# Patient Record
Sex: Male | Born: 1970 | Race: Black or African American | Hispanic: No | Marital: Single | Smoking: Former smoker
Health system: Southern US, Community
[De-identification: ages and names within clinical notes are randomized; demographics above are authoritative.]

---

## 2005-04-09 ENCOUNTER — Ambulatory Visit: Payer: Self-pay | Admitting: Otolaryngology

## 2005-04-30 ENCOUNTER — Ambulatory Visit: Payer: Self-pay | Admitting: Otolaryngology

## 2007-05-25 ENCOUNTER — Emergency Department: Payer: Self-pay | Admitting: Emergency Medicine

## 2009-03-13 ENCOUNTER — Emergency Department: Payer: Self-pay | Admitting: Emergency Medicine

## 2009-03-15 ENCOUNTER — Inpatient Hospital Stay: Payer: Self-pay | Admitting: Internal Medicine

## 2010-05-14 ENCOUNTER — Emergency Department: Payer: Self-pay | Admitting: Emergency Medicine

## 2018-03-22 ENCOUNTER — Encounter: Payer: Self-pay | Admitting: Emergency Medicine

## 2018-03-22 ENCOUNTER — Emergency Department: Payer: Self-pay

## 2018-03-22 ENCOUNTER — Emergency Department
Admission: EM | Admit: 2018-03-22 | Discharge: 2018-03-22 | Disposition: A | Discharge status: Home / Self Care | Payer: Self-pay | Attending: Emergency Medicine | Admitting: Emergency Medicine

## 2018-03-22 ENCOUNTER — Other Ambulatory Visit: Payer: Self-pay

## 2018-03-22 DIAGNOSIS — Z87891 Personal history of nicotine dependence: Secondary | ICD-10-CM | POA: Insufficient documentation

## 2018-03-22 DIAGNOSIS — Y998 Other external cause status: Secondary | ICD-10-CM | POA: Insufficient documentation

## 2018-03-22 DIAGNOSIS — S60221A Contusion of right hand, initial encounter: Secondary | ICD-10-CM | POA: Insufficient documentation

## 2018-03-22 DIAGNOSIS — Y939 Activity, unspecified: Secondary | ICD-10-CM | POA: Insufficient documentation

## 2018-03-22 DIAGNOSIS — W540XXA Bitten by dog, initial encounter: Secondary | ICD-10-CM | POA: Insufficient documentation

## 2018-03-22 DIAGNOSIS — Z23 Encounter for immunization: Secondary | ICD-10-CM | POA: Insufficient documentation

## 2018-03-22 DIAGNOSIS — Y92009 Unspecified place in unspecified non-institutional (private) residence as the place of occurrence of the external cause: Secondary | ICD-10-CM | POA: Insufficient documentation

## 2018-03-22 MED ORDER — TETANUS-DIPHTH-ACELL PERTUSSIS 5-2.5-18.5 LF-MCG/0.5 IM SUSP
0.5000 mL | Freq: Once | INTRAMUSCULAR | Status: AC
  Administered 2018-03-22: 0.5 mL via INTRAMUSCULAR
  Filled 2018-03-22: qty 0.5

## 2018-03-22 MED ORDER — AMOXICILLIN-POT CLAVULANATE 875-125 MG PO TABS: 1.0000 | ORAL_TABLET | Freq: Two times a day (BID) | ORAL | 0 refills | Status: AC

## 2018-03-22 NOTE — ED Triage Notes (Signed)
Dog bites couple days ago.  Says the dog is back, but he is unsure of shots.  Right had is swollen where he was bitten there.  He says he wa also bitten on leg and side.

## 2018-03-22 NOTE — ED Provider Notes (Signed)
[facility] Emergency Department Provider Note   ____________________________________________   First MD Initiated Contact with Patient 03/22/18 1358     (approximate)  I have reviewed the triage vital signs and the nursing notes.   HISTORY  Chief Complaint Animal Bite and Hand Pain  HPI Keith Stevenson is a 47 y.o. male presents to the ED with complaint of a dog bite.  Patient states that 2 days ago he was at his neighbor's house.  He states that the dog that bit him had puppies.  This is the only reason he believes that he was bitten is because he was near the puppies.  Patient was bit on his right side and also left thigh.  Patient reports that he fell backwards injuring his right hand and had a scrape to his scalp.  He denies any loss of consciousness.  He states he is continued to have no problems since that time other than his right hand has been swollen and painful.  His neighbor has papers to prove that the dog is up-to-date on rabies immunizations.  He states that [employer] was called.  Patient is unsure of the last tetanus he received.  History reviewed. No pertinent past medical history.  There are no active problems to display for this patient.   History reviewed. No pertinent surgical history.  Prior to Admission medications   Medication Sig Start Date End Date Taking? Authorizing Provider  amoxicillin-clavulanate (AUGMENTIN) 875-125 MG tablet Take 1 tablet by mouth 2 (two) times daily for 7 days. 03/22/18 03/29/18  Tommi Rumps, PA-C    Allergies Patient has no known allergies.  No family history on file.  Social History Social History   Tobacco Use  . Smoking status: Former Smoker  Substance Use Topics  . Alcohol use: Not on file  . Drug use: Not on file    Review of Systems Constitutional: No fever/chills Eyes: No visual changes. ENT: No trauma. Cardiovascular: Denies chest pain. Respiratory: Denies  shortness of breath. Gastrointestinal: No abdominal pain.  No nausea, no vomiting.   Musculoskeletal: Positive right hand pain. Skin: Positive abrasion scalp, dog bite right trunk and left thigh. Neurological: Negative for headaches, focal weakness or numbness. ___________________________________________   PHYSICAL EXAM:  VITAL SIGNS: ED Triage Vitals  Enc Vitals Group     BP 03/22/18 1203 (!) 140/96     Pulse Rate 03/22/18 1203 80     Resp 03/22/18 1203 18     Temp 03/22/18 1203 98.7 F (37.1 C)     Temp Source 03/22/18 1203 Oral     SpO2 03/22/18 1203 98 %     Weight 03/22/18 1205 282 lb (127.9 kg)     Height 03/22/18 1205 6\' 7"  (2.007 m)     Head Circumference --      Peak Flow --      Pain Score 03/22/18 1205 7     Pain Loc --      Pain Edu? --      Excl. in GC? --    Constitutional: Alert and oriented. Well appearing and in no acute distress. Eyes: Conjunctivae are normal. PERRL. EOMI. Head: Atraumatic. Nose: No trauma. Neck: No stridor.  No cervical tenderness on palpation posteriorly. Cardiovascular: Normal rate, regular rhythm. Grossly normal heart sounds.  Good peripheral circulation. Respiratory: Normal respiratory effort.  No retractions. Lungs CTAB. Gastrointestinal: Soft and nontender. No distention.   Musculoskeletal:  Right anterior trunk there is a isolated superficial  puncture wound without evidence of infection.  No drainage is from the site.  Minimal tenderness.  On examination of the left thigh anteriorly mid thigh there is no ecchymosis and 2 puncture wounds are present without drainage.  Patient is ambulatory without any assistance. Neurologic:  Normal speech and language. No gross focal neurologic deficits are appreciated.  Skin:  Skin is warm, dry and intact.  Dog bite puncture wounds as noted above. Psychiatric: Mood and affect are normal. Speech and behavior are normal.  ____________________________________________   LABS (all labs ordered are  listed, but only abnormal results are displayed)  Labs Reviewed - No data to display ____________________________________________  RADIOLOGY  ED MD interpretation:   Right hand x-ray is negative for acute injury.  Official radiology report(s): Dg Hand Complete Right  Result Date: 03/22/2018 CLINICAL DATA:  Right hand pain and swelling after fall. EXAM: RIGHT HAND - COMPLETE 3+ VIEW COMPARISON:  None. FINDINGS: There is no evidence of fracture or dislocation. There is no evidence of arthropathy or other focal bone abnormality. Soft tissues are unremarkable. IMPRESSION: Negative. Electronically Signed   By: Lupita Raider, M.D.   On: 03/22/2018 12:43    ____________________________________________   PROCEDURES  Procedure(s) performed: None  Procedures  Critical Care performed: No  ____________________________________________   INITIAL IMPRESSION / ASSESSMENT AND PLAN / ED COURSE  As part of my medical decision making, I reviewed the following data within the electronic MEDICAL RECORD NUMBER Notes from prior ED visits and [place] Controlled Substance Database  Patient presents to the ED with complaint of 2 individual dog bites that occurred 2 days ago.  Incidents was caused by patient getting too close to his neighbors dogs puppies.  Patient states that neighbor does have papers proving that the dog has up-to-date rabies immunizations.  Areas appear to be healing without any signs of infection.  Patient was placed on Augmentin 875 twice daily for the next 7 days.  He is to continue keeping the areas clean and dry and to clean daily with mild soap and water.  He is to return to the emergency department if any signs of infection or any problems.  ____________________________________________   FINAL CLINICAL IMPRESSION(S) / ED DIAGNOSES  Final diagnoses:  Dog bite, initial encounter  Contusion of right hand, initial encounter     ED Discharge Orders         Ordered     amoxicillin-clavulanate (AUGMENTIN) 875-125 MG tablet  2 times daily     03/22/18 1421           Note:  This document was prepared using Dragon voice recognition software and may include unintentional dictation errors.    Tommi Rumps, PA-C 03/22/18 1615    Sharyn Creamer, MD 03/22/18 [phone]

## 2018-03-22 NOTE — ED Notes (Signed)
Per officer Sigurd Sos, dog bite has already been reported to [employer].

## 2018-03-22 NOTE — ED Triage Notes (Signed)
Dog bite L thigh and abdomen 2 days ago. States was his neighbors dog. States his neighbor said dog had rabies 1 1/2 year ago but did not produce paperwork. Dog was pit bull. Did not report to animal control. Occurred in town of [place]. Also R hand swollen, fell at time.

## 2018-03-22 NOTE — ED Notes (Addendum)
Pt presents with dog bites to left leg, right side of torso. Pt states he didn't come in Saturday because the dog ran off and he was trying to find the dog. The dog is back home now. Owner states that dog has had rabies shot but cannot produce paperwork. Pt also complains of pain in his right hand, which is swollen, and the back of his head, which were injured when he was backing away from the dog and fell. Pt states he was told he passed out for a few seconds. Pt alert & oriented with nad noted.

## 2018-03-22 NOTE — Discharge Instructions (Signed)
Follow-up with your primary care provider or [facility] acute care if any continued problems.  Return to the emergency department if any severe worsening of your symptoms or urgent concerns.  Return to the emergency department also if any of the bite marks become infected.  Begin taking Augmentin 875 twice daily for the next 7 days.  You also need to clean the bite areas with mild soap and water twice a day.  You may take Tylenol or ibuprofen as needed for pain.  You are also given a tetanus booster.

## 2021-10-03 ENCOUNTER — Other Ambulatory Visit: Payer: Self-pay

## 2021-10-03 ENCOUNTER — Emergency Department: Payer: Self-pay

## 2021-10-03 ENCOUNTER — Emergency Department
Admission: EM | Admit: 2021-10-03 | Discharge: 2021-10-03 | Disposition: A | Discharge status: Home / Self Care | Payer: Self-pay | Attending: Emergency Medicine | Admitting: Emergency Medicine

## 2021-10-03 DIAGNOSIS — Z23 Encounter for immunization: Secondary | ICD-10-CM | POA: Insufficient documentation

## 2021-10-03 DIAGNOSIS — W540XXA Bitten by dog, initial encounter: Secondary | ICD-10-CM

## 2021-10-03 DIAGNOSIS — R030 Elevated blood-pressure reading, without diagnosis of hypertension: Secondary | ICD-10-CM

## 2021-10-03 DIAGNOSIS — S8392XA Sprain of unspecified site of left knee, initial encounter: Secondary | ICD-10-CM

## 2021-10-03 DIAGNOSIS — W57XXXA Bitten or stung by nonvenomous insect and other nonvenomous arthropods, initial encounter: Secondary | ICD-10-CM | POA: Insufficient documentation

## 2021-10-03 MED ORDER — TETANUS-DIPHTH-ACELL PERTUSSIS 5-2.5-18.5 LF-MCG/0.5 IM SUSY
0.5000 mL | PREFILLED_SYRINGE | Freq: Once | INTRAMUSCULAR | Status: AC
  Administered 2021-10-03: 0.5 mL via INTRAMUSCULAR
  Filled 2021-10-03: qty 0.5

## 2021-10-03 MED ORDER — AMOXICILLIN-POT CLAVULANATE 875-125 MG PO TABS: 1.0000 | ORAL_TABLET | Freq: Two times a day (BID) | ORAL | 0 refills | Status: AC

## 2021-10-03 NOTE — Discharge Instructions (Addendum)
Follow-up with urgent care or [facility] acute care if any continued problems or concerns of infection to urinate.  Clean area daily with mild soap and water and watch for signs of infection however a prescription for an antibiotic was sent to the pharmacy to begin taking today.  You may continue taking ibuprofen or Tylenol as needed for your knee.  Elevate and apply ice as needed for knee pain.

## 2021-10-03 NOTE — ED Triage Notes (Signed)
Pt reports a Journalist, newspaper canine bit him on the left leg yesterday while standing on the street. Bleeding controlled

## 2021-10-03 NOTE — ED Provider Notes (Signed)
[facility] Provider Note    Event Date/Time   First MD Initiated Contact with Patient 10/03/21 [phone]     (approximate)   History   Animal Bite   HPI  Keith Stevenson is a 51 y.o. male   presents to the ED with complaint of dog bite to his left knee that occurred yesterday.  Patient was bit by a Software engineer in [place].  He he also reports he has had knee pain as he was backing up to prevent the dog from biting him and stepped in a hole.  He is unsure of his last tetanus immunization.  He denies any health problems.      Physical Exam   Triage Vital Signs: ED Triage Vitals [10/03/21 0925]  Enc Vitals Group     BP (!) 158/116     Pulse Rate 96     Resp 16     Temp 98.3 F (36.8 C)     Temp Source Oral     SpO2 97 %     Weight      Height      Head Circumference      Peak Flow      Pain Score      Pain Loc      Pain Edu?      Excl. in GC?     Most recent vital signs: Vitals:   10/03/21 0926 10/03/21 1040  BP:  (!) 143/105  Pulse: 87   Resp:    Temp:    SpO2: 98%      General: Awake, no distress.  CV:  Good peripheral perfusion.  Heart regular rate and rhythm. Resp:  Normal effort.  Lungs are clear bilaterally. Abd:  No distention.  Other:  Left knee anteriorly   ED Results / Procedures / Treatments   Labs (all labs ordered are listed, but only abnormal results are displayed) Labs Reviewed - No data to display    RADIOLOGY  Left knee x-ray images were interpreted by myself independent of the radiologist and no tooth fragments or bony injury is noted.  Radiology report is negative.   PROCEDURES:  Critical Care performed:   Procedures   MEDICATIONS ORDERED IN ED: Medications  Tdap (BOOSTRIX) injection 0.5 mL (0.5 mLs Intramuscular Given 10/03/21 1022)     IMPRESSION / MDM / ASSESSMENT AND PLAN / ED COURSE  I reviewed the triage vital signs and the nursing notes.   Differential diagnosis includes, but is  not limited to, dog bite, infected dog bite, foreign body left knee, left knee sprain, left knee fracture.  Elevated blood pressure, hypertension undiagnosed.   51 year old male presents to the ED with complaint of a dog bite that occurred yesterday from a Software engineer in [place].  Patient also complains that his knee has been hurting due to stepping back and possibly stepping into a hole which caused him to twist his knee.  Patient has continued to ambulate since that time.  He is unaware of last immunizations and a Tdap was given to him while in the emergency department.  We discussed his elevated blood pressure and to his knowledge there is no family history of hypertension.  I stressed that this needs to be checked outside of the emergency department and also he needs to find a primary care provider.  [facility] given to him not only to recheck his blood pressure but also to obtain a  PCP.  A prescription for Augmentin was sent to the pharmacy to take for the next 7 days.  Patient reports that he understands and will comply.       FINAL CLINICAL IMPRESSION(S) / ED DIAGNOSES   Final diagnoses:  Dog bite, initial encounter  Sprain of left knee, unspecified ligament, initial encounter  Elevated blood pressure reading     Rx / DC Orders   ED Discharge Orders          Ordered    amoxicillin-clavulanate (AUGMENTIN) 875-125 MG tablet  2 times daily        10/03/21 1054             Note:  This document was prepared using Dragon voice recognition software and may include unintentional dictation errors.   Tommi Rumps, PA-C 10/03/21 1106    Concha Se, MD 10/03/21 1344

## 2021-10-03 NOTE — ED Notes (Signed)
See triage note. Pt brought to room in wheelchair. Pt was bit by canine yesterday. Pt c/o 8/10 left knee and leg pain. Bandage taken off and bleeding controlled at this time.

## 2021-11-05 ENCOUNTER — Telehealth: Payer: Self-pay

## 2024-01-13 IMAGING — DX DG KNEE COMPLETE 4+V*L*
4 series · 4 of 4 positions shown · non-contrast
Comparison: None Available.

CLINICAL DATA: Dog bite and knee injury.

EXAM:
LEFT KNEE - COMPLETE 4+ VIEW

[knee ap]
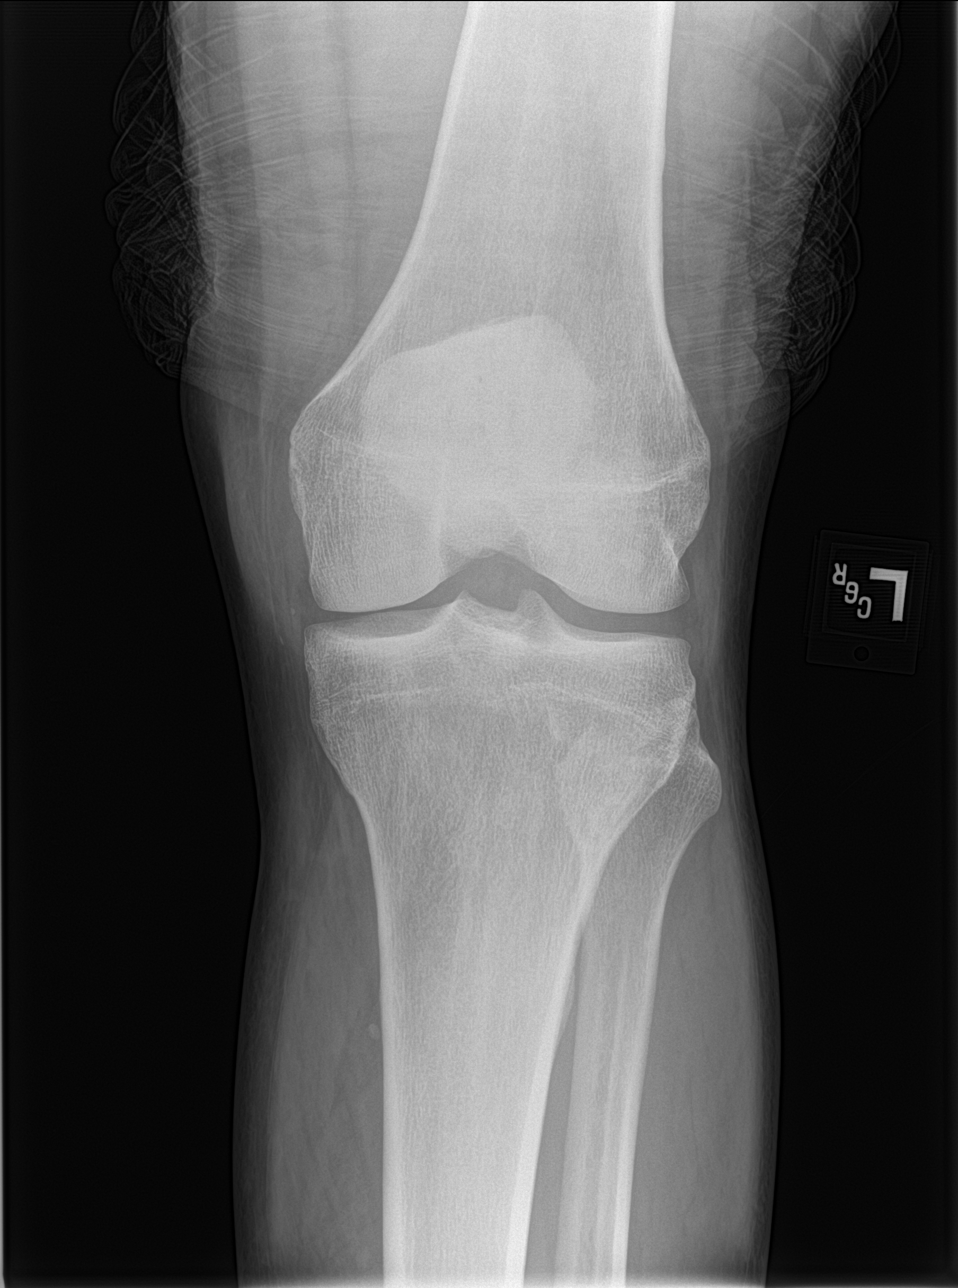

[knee lat]
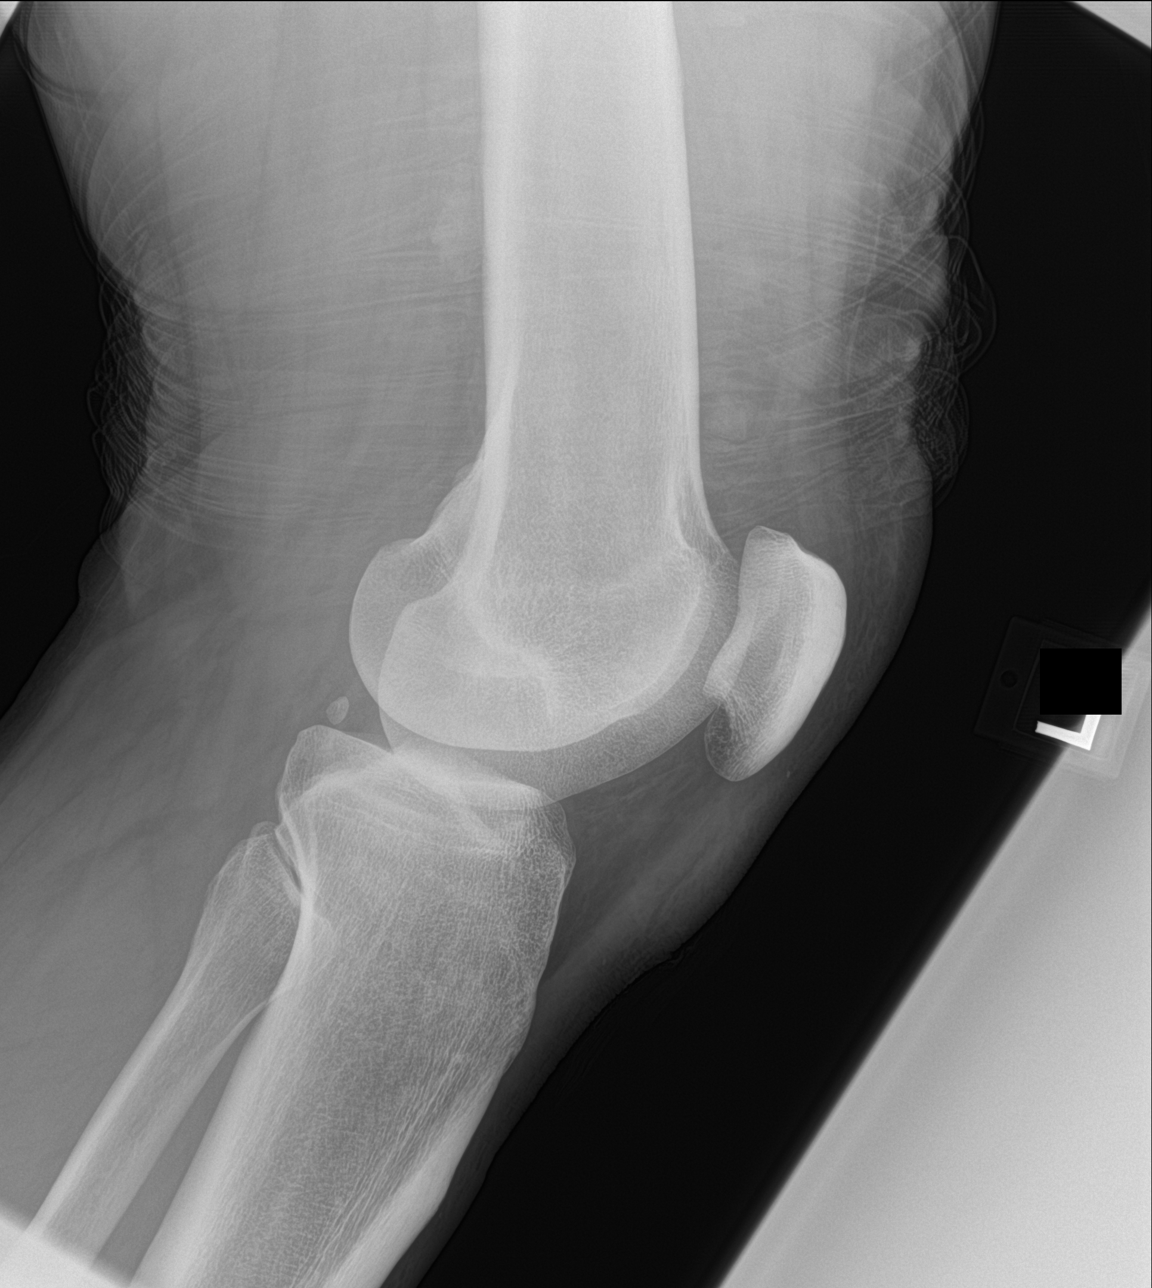

[knee obl (1 of 2)]
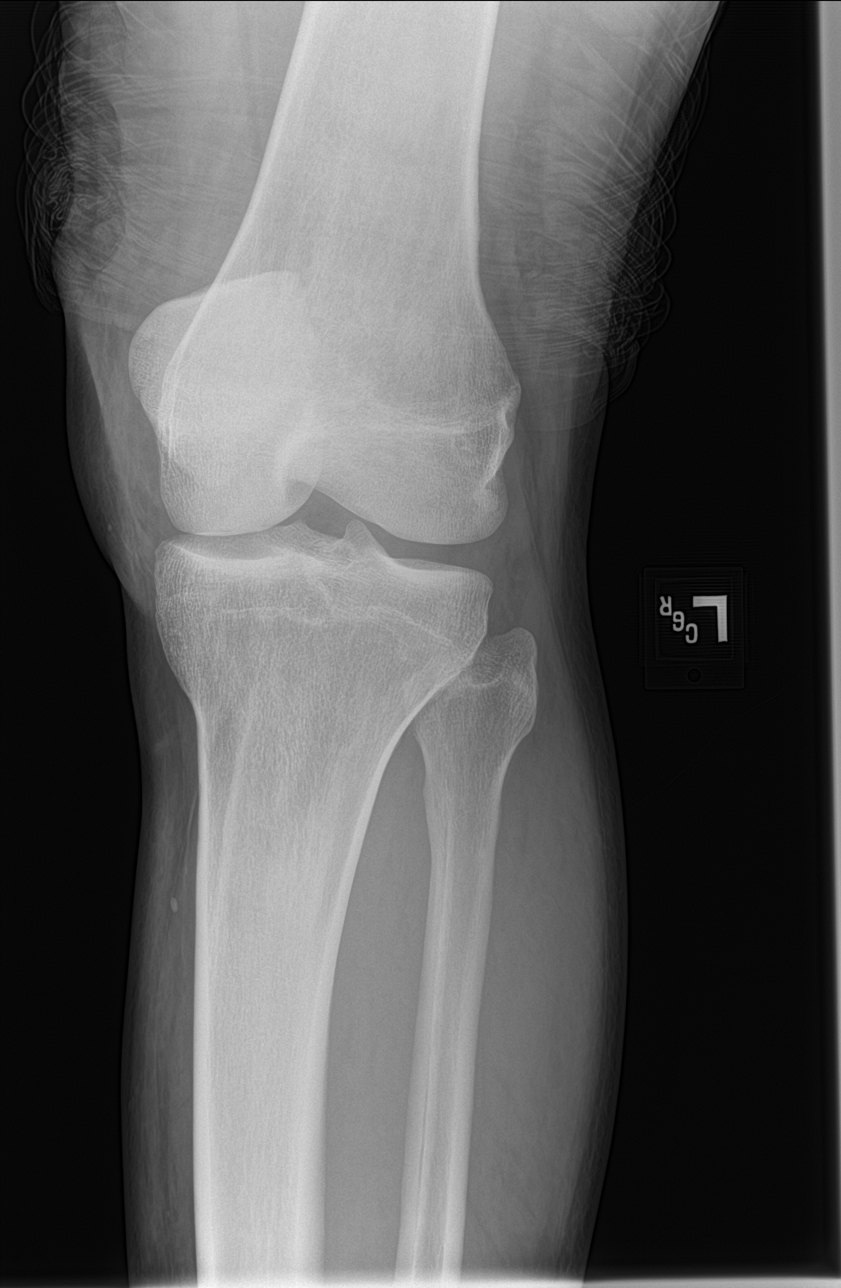

[knee obl (2 of 2)]
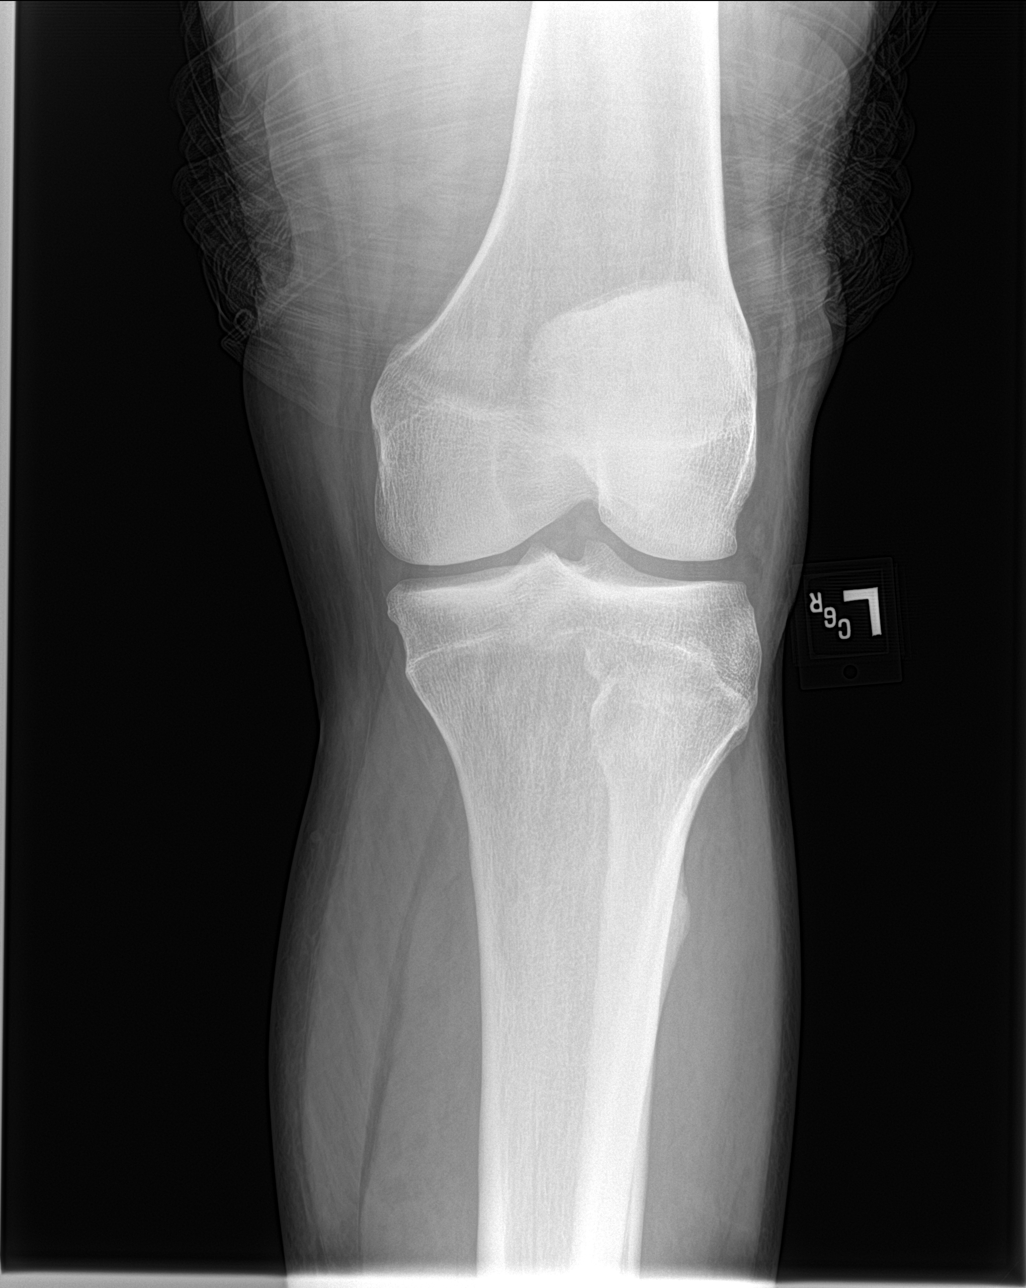

[4 of 4 positions shown; findings below may reference images not displayed]

FINDINGS: No evidence of fracture, dislocation, or joint effusion. No evidence
of arthropathy or other focal bone abnormality. Soft tissues are
unremarkable.
IMPRESSION: Negative.
